# Patient Record
Sex: Female | Born: 1995 | Race: White | Hispanic: No | Marital: Single | Smoking: Never smoker
Health system: Southern US, Community
[De-identification: ages and names within clinical notes are randomized; demographics above are authoritative.]

## PROBLEM LIST (undated history)

## (undated) DIAGNOSIS — K37 Unspecified appendicitis: Secondary | ICD-10-CM

## (undated) HISTORY — PX: TONSILLECTOMY: SUR1361

---

## 2018-03-01 ENCOUNTER — Inpatient Hospital Stay (HOSPITAL_COMMUNITY)
Admission: EM | Admit: 2018-03-01 | Discharge: 2018-03-03 | DRG: 343 | Disposition: A | Discharge status: Home / Self Care | Payer: Medicaid Other | Attending: Surgery | Admitting: Surgery

## 2018-03-01 ENCOUNTER — Encounter (HOSPITAL_COMMUNITY): Payer: Self-pay | Admitting: Emergency Medicine

## 2018-03-01 DIAGNOSIS — K3580 Unspecified acute appendicitis: Secondary | ICD-10-CM | POA: Diagnosis present

## 2018-03-01 HISTORY — DX: Unspecified appendicitis: K37

## 2018-03-01 LAB — CBC
HEMATOCRIT: 34.5 % — AB (ref 36.0–46.0)
Hemoglobin: 11.2 g/dL — ABNORMAL LOW (ref 12.0–15.0)
MCH: 28.6 pg (ref 26.0–34.0)
MCHC: 32.5 g/dL (ref 30.0–36.0)
MCV: 88 fL (ref 78.0–100.0)
PLATELETS: 255 10*3/uL (ref 150–400)
RBC: 3.92 MIL/uL (ref 3.87–5.11)
RDW: 12.4 % (ref 11.5–15.5)
WBC: 12.6 10*3/uL — AB (ref 4.0–10.5)

## 2018-03-01 LAB — URINALYSIS, ROUTINE W REFLEX MICROSCOPIC
Bilirubin Urine: NEGATIVE
Glucose, UA: NEGATIVE mg/dL
Hgb urine dipstick: NEGATIVE
KETONES UR: NEGATIVE mg/dL
LEUKOCYTES UA: NEGATIVE
NITRITE: NEGATIVE
PH: 5 (ref 5.0–8.0)
PROTEIN: NEGATIVE mg/dL
Specific Gravity, Urine: 1.018 (ref 1.005–1.030)

## 2018-03-01 LAB — COMPREHENSIVE METABOLIC PANEL
ALT: 14 U/L (ref 0–44)
AST: 17 U/L (ref 15–41)
Albumin: 4.3 g/dL (ref 3.5–5.0)
Alkaline Phosphatase: 80 U/L (ref 38–126)
Anion gap: 8 (ref 5–15)
BILIRUBIN TOTAL: 0.4 mg/dL (ref 0.3–1.2)
BUN: 9 mg/dL (ref 6–20)
CO2: 24 mmol/L (ref 22–32)
Calcium: 9.4 mg/dL (ref 8.9–10.3)
Chloride: 109 mmol/L (ref 98–111)
Creatinine, Ser: 0.75 mg/dL (ref 0.44–1.00)
GFR calc Af Amer: >60 mL/min (ref >60)
Glucose, Bld: 99 mg/dL (ref 70–99)
POTASSIUM: 3.9 mmol/L (ref 3.5–5.1)
Sodium: 141 mmol/L (ref 135–145)
TOTAL PROTEIN: 7.1 g/dL (ref 6.5–8.1)

## 2018-03-01 LAB — I-STAT BETA HCG BLOOD, ED (MC, WL, AP ONLY)

## 2018-03-01 LAB — LIPASE, BLOOD: Lipase: 33 U/L (ref 11–51)

## 2018-03-01 MED ORDER — FENTANYL CITRATE (PF) 100 MCG/2ML IJ SOLN
50.0000 ug | Freq: Once | INTRAMUSCULAR | Status: AC
  Administered 2018-03-01: 50 ug via INTRAVENOUS
  Filled 2018-03-01: qty 2

## 2018-03-01 MED ORDER — ONDANSETRON HCL 4 MG/2ML IJ SOLN
4.0000 mg | Freq: Once | INTRAMUSCULAR | Status: AC
  Administered 2018-03-01: 4 mg via INTRAVENOUS
  Filled 2018-03-01: qty 2

## 2018-03-01 MED ORDER — SODIUM CHLORIDE 0.9 % IV SOLN
INTRAVENOUS | Status: DC
  Administered 2018-03-01: via INTRAVENOUS

## 2018-03-01 NOTE — ED Triage Notes (Signed)
Pt c/o mid to right abd pain that started at noon today. Reported vomited once. Hx appendicitis that was treated with antibiotics instead of surgery.

## 2018-03-02 ENCOUNTER — Encounter (HOSPITAL_COMMUNITY): Admission: EM | Disposition: A | Discharge status: Home / Self Care | Payer: Self-pay | Source: Home / Self Care

## 2018-03-02 ENCOUNTER — Encounter (HOSPITAL_COMMUNITY): Payer: Self-pay | Admitting: Emergency Medicine

## 2018-03-02 ENCOUNTER — Emergency Department (HOSPITAL_COMMUNITY): Payer: Medicaid Other

## 2018-03-02 ENCOUNTER — Other Ambulatory Visit: Payer: Self-pay

## 2018-03-02 ENCOUNTER — Inpatient Hospital Stay (HOSPITAL_COMMUNITY): Payer: Medicaid Other | Admitting: Registered Nurse

## 2018-03-02 DIAGNOSIS — K3580 Unspecified acute appendicitis: Secondary | ICD-10-CM | POA: Diagnosis present

## 2018-03-02 HISTORY — PX: LAPAROSCOPIC APPENDECTOMY: SHX408

## 2018-03-02 LAB — HIV ANTIBODY (ROUTINE TESTING W REFLEX): HIV SCREEN 4TH GENERATION: NONREACTIVE

## 2018-03-02 LAB — MRSA PCR SCREENING: MRSA BY PCR: NEGATIVE

## 2018-03-02 SURGERY — APPENDECTOMY, LAPAROSCOPIC
Anesthesia: General

## 2018-03-02 MED ORDER — OXYCODONE HCL 5 MG PO TABS
5.0000 mg | ORAL_TABLET | ORAL | Status: DC | PRN
  Administered 2018-03-03: 5 mg via ORAL
  Filled 2018-03-02: qty 1

## 2018-03-02 MED ORDER — SUGAMMADEX SODIUM 200 MG/2ML IV SOLN
INTRAVENOUS | Status: AC
  Filled 2018-03-02: qty 2

## 2018-03-02 MED ORDER — CELECOXIB 200 MG PO CAPS
400.0000 mg | ORAL_CAPSULE | Freq: Two times a day (BID) | ORAL | Status: DC
  Administered 2018-03-02 – 2018-03-03 (×2): 400 mg via ORAL
  Filled 2018-03-02 (×2): qty 2

## 2018-03-02 MED ORDER — FENTANYL CITRATE (PF) 250 MCG/5ML IJ SOLN
INTRAMUSCULAR | Status: AC
  Filled 2018-03-02: qty 5

## 2018-03-02 MED ORDER — METRONIDAZOLE IN NACL 5-0.79 MG/ML-% IV SOLN
500.0000 mg | Freq: Once | INTRAVENOUS | Status: AC
  Administered 2018-03-02: 500 mg via INTRAVENOUS
  Filled 2018-03-02: qty 100

## 2018-03-02 MED ORDER — MIDAZOLAM HCL 2 MG/2ML IJ SOLN: 0.5000 mg | Freq: Once | INTRAMUSCULAR | Status: DC | PRN

## 2018-03-02 MED ORDER — SODIUM CHLORIDE 0.9 % IJ SOLN
INTRAMUSCULAR | Status: AC
  Filled 2018-03-02: qty 50

## 2018-03-02 MED ORDER — SCOPOLAMINE 1 MG/3DAYS TD PT72
1.0000 | MEDICATED_PATCH | Freq: Once | TRANSDERMAL | Status: AC
  Administered 2018-03-02: 1 via TRANSDERMAL
  Filled 2018-03-02: qty 1

## 2018-03-02 MED ORDER — DEXTROSE-NACL 5-0.9 % IV SOLN
INTRAVENOUS | Status: DC
  Administered 2018-03-02 (×2): via INTRAVENOUS

## 2018-03-02 MED ORDER — LIDOCAINE 2% (20 MG/ML) 5 ML SYRINGE
INTRAMUSCULAR | Status: DC | PRN
  Administered 2018-03-02: 50 mg via INTRAVENOUS

## 2018-03-02 MED ORDER — FENTANYL CITRATE (PF) 100 MCG/2ML IJ SOLN
INTRAMUSCULAR | Status: DC | PRN
  Administered 2018-03-02 (×3): 50 ug via INTRAVENOUS

## 2018-03-02 MED ORDER — LACTATED RINGERS IV SOLN
INTRAVENOUS | Status: DC
  Administered 2018-03-02: 1000 mL via INTRAVENOUS

## 2018-03-02 MED ORDER — DEXAMETHASONE SODIUM PHOSPHATE 10 MG/ML IJ SOLN
INTRAMUSCULAR | Status: DC | PRN
  Administered 2018-03-02: 8 mg via INTRAVENOUS

## 2018-03-02 MED ORDER — EPHEDRINE 5 MG/ML INJ
INTRAVENOUS | Status: AC
  Filled 2018-03-02: qty 10

## 2018-03-02 MED ORDER — SUCCINYLCHOLINE CHLORIDE 200 MG/10ML IV SOSY
PREFILLED_SYRINGE | INTRAVENOUS | Status: AC
  Filled 2018-03-02: qty 10

## 2018-03-02 MED ORDER — ONDANSETRON HCL 4 MG/2ML IJ SOLN
INTRAMUSCULAR | Status: DC | PRN
  Administered 2018-03-02: 4 mg via INTRAVENOUS

## 2018-03-02 MED ORDER — SUCCINYLCHOLINE CHLORIDE 200 MG/10ML IV SOSY
PREFILLED_SYRINGE | INTRAVENOUS | Status: DC | PRN
  Administered 2018-03-02: 100 mg via INTRAVENOUS

## 2018-03-02 MED ORDER — ONDANSETRON HCL 4 MG/2ML IJ SOLN
4.0000 mg | Freq: Four times a day (QID) | INTRAMUSCULAR | Status: DC | PRN
  Administered 2018-03-03: 4 mg via INTRAVENOUS
  Filled 2018-03-02: qty 2

## 2018-03-02 MED ORDER — IOPAMIDOL (ISOVUE-300) INJECTION 61%
100.0000 mL | Freq: Once | INTRAVENOUS | Status: AC | PRN
  Administered 2018-03-02: 100 mL via INTRAVENOUS

## 2018-03-02 MED ORDER — DEXAMETHASONE SODIUM PHOSPHATE 10 MG/ML IJ SOLN
INTRAMUSCULAR | Status: AC
  Filled 2018-03-02: qty 1

## 2018-03-02 MED ORDER — SUGAMMADEX SODIUM 200 MG/2ML IV SOLN
INTRAVENOUS | Status: DC | PRN
  Administered 2018-03-02: 130 mg via INTRAVENOUS

## 2018-03-02 MED ORDER — DEXTROSE-NACL 5-0.9 % IV SOLN
INTRAVENOUS | Status: DC
  Administered 2018-03-02 – 2018-03-03 (×2): via INTRAVENOUS

## 2018-03-02 MED ORDER — ENOXAPARIN SODIUM 40 MG/0.4ML ~~LOC~~ SOLN: 40.0000 mg | SUBCUTANEOUS | Status: DC

## 2018-03-02 MED ORDER — PROPOFOL 10 MG/ML IV BOLUS
INTRAVENOUS | Status: AC
  Filled 2018-03-02: qty 20

## 2018-03-02 MED ORDER — FENTANYL CITRATE (PF) 100 MCG/2ML IJ SOLN
50.0000 ug | INTRAMUSCULAR | Status: DC | PRN
  Administered 2018-03-02 (×3): 50 ug via INTRAVENOUS
  Filled 2018-03-02 (×3): qty 2

## 2018-03-02 MED ORDER — PROPOFOL 10 MG/ML IV BOLUS
INTRAVENOUS | Status: DC | PRN
  Administered 2018-03-02: 100 mg via INTRAVENOUS

## 2018-03-02 MED ORDER — IOPAMIDOL (ISOVUE-300) INJECTION 61%
INTRAVENOUS | Status: AC
  Filled 2018-03-02: qty 100

## 2018-03-02 MED ORDER — SODIUM CHLORIDE 0.9 % IV SOLN
2.0000 g | Freq: Once | INTRAVENOUS | Status: AC
  Administered 2018-03-02: 2 g via INTRAVENOUS
  Filled 2018-03-02: qty 20

## 2018-03-02 MED ORDER — SODIUM CHLORIDE 0.9 % IV SOLN
2.0000 g | INTRAVENOUS | Status: DC
  Filled 2018-03-02: qty 20

## 2018-03-02 MED ORDER — MIDAZOLAM HCL 2 MG/2ML IJ SOLN
INTRAMUSCULAR | Status: AC
  Filled 2018-03-02: qty 2

## 2018-03-02 MED ORDER — PROMETHAZINE HCL 25 MG/ML IJ SOLN: 6.2500 mg | INTRAMUSCULAR | Status: DC | PRN

## 2018-03-02 MED ORDER — ACETAMINOPHEN 325 MG PO TABS
650.0000 mg | ORAL_TABLET | ORAL | Status: DC | PRN
  Administered 2018-03-02: 650 mg via ORAL
  Filled 2018-03-02: qty 2

## 2018-03-02 MED ORDER — EPHEDRINE SULFATE-NACL 50-0.9 MG/10ML-% IV SOSY
PREFILLED_SYRINGE | INTRAVENOUS | Status: DC | PRN
  Administered 2018-03-02 (×2): 5 mg via INTRAVENOUS

## 2018-03-02 MED ORDER — ROCURONIUM BROMIDE 10 MG/ML (PF) SYRINGE
PREFILLED_SYRINGE | INTRAVENOUS | Status: DC | PRN
  Administered 2018-03-02: 30 mg via INTRAVENOUS

## 2018-03-02 MED ORDER — ROCURONIUM BROMIDE 10 MG/ML (PF) SYRINGE
PREFILLED_SYRINGE | INTRAVENOUS | Status: AC
  Filled 2018-03-02: qty 10

## 2018-03-02 MED ORDER — HYDROMORPHONE HCL 1 MG/ML IJ SOLN: 0.2500 mg | INTRAMUSCULAR | Status: DC | PRN

## 2018-03-02 MED ORDER — MIDAZOLAM HCL 5 MG/5ML IJ SOLN
INTRAMUSCULAR | Status: DC | PRN
  Administered 2018-03-02: 2 mg via INTRAVENOUS

## 2018-03-02 MED ORDER — BUPIVACAINE-EPINEPHRINE 0.5% -1:200000 IJ SOLN
INTRAMUSCULAR | Status: DC | PRN
  Administered 2018-03-02: 30 mL

## 2018-03-02 MED ORDER — METRONIDAZOLE IN NACL 5-0.79 MG/ML-% IV SOLN
500.0000 mg | Freq: Three times a day (TID) | INTRAVENOUS | Status: DC
  Administered 2018-03-02: 500 mg via INTRAVENOUS
  Filled 2018-03-02: qty 100

## 2018-03-02 MED ORDER — MEPERIDINE HCL 50 MG/ML IJ SOLN: 6.2500 mg | INTRAMUSCULAR | Status: DC | PRN

## 2018-03-02 MED ORDER — GABAPENTIN 300 MG PO CAPS
300.0000 mg | ORAL_CAPSULE | Freq: Two times a day (BID) | ORAL | Status: DC
  Administered 2018-03-02 – 2018-03-03 (×3): 300 mg via ORAL
  Filled 2018-03-02 (×3): qty 1

## 2018-03-02 MED ORDER — LIDOCAINE 2% (20 MG/ML) 5 ML SYRINGE
INTRAMUSCULAR | Status: AC
  Filled 2018-03-02: qty 5

## 2018-03-02 MED ORDER — ONDANSETRON HCL 4 MG/2ML IJ SOLN
INTRAMUSCULAR | Status: AC
  Filled 2018-03-02: qty 2

## 2018-03-02 SURGICAL SUPPLY — 35 items
APPLIER CLIP 5 13 M/L LIGAMAX5 (MISCELLANEOUS)
APPLIER CLIP ROT 10 11.4 M/L (STAPLE)
CABLE HIGH FREQUENCY MONO STRZ (ELECTRODE) IMPLANT
CHLORAPREP W/TINT 26ML (MISCELLANEOUS) ×3 IMPLANT
CLIP APPLIE 5 13 M/L LIGAMAX5 (MISCELLANEOUS) IMPLANT
CLIP APPLIE ROT 10 11.4 M/L (STAPLE) IMPLANT
COVER SURGICAL LIGHT HANDLE (MISCELLANEOUS) ×3 IMPLANT
CUTTER FLEX LINEAR 45M (STAPLE) IMPLANT
DECANTER SPIKE VIAL GLASS SM (MISCELLANEOUS) IMPLANT
DERMABOND ADVANCED (GAUZE/BANDAGES/DRESSINGS) ×2
DERMABOND ADVANCED .7 DNX12 (GAUZE/BANDAGES/DRESSINGS) ×1 IMPLANT
DRAPE LAPAROSCOPIC ABDOMINAL (DRAPES) ×3 IMPLANT
ELECT REM PT RETURN 15FT ADLT (MISCELLANEOUS) ×3 IMPLANT
GLOVE BIOGEL PI IND STRL 7.5 (GLOVE) ×1 IMPLANT
GLOVE BIOGEL PI INDICATOR 7.5 (GLOVE) ×2
GLOVE ECLIPSE 7.5 STRL STRAW (GLOVE) ×3 IMPLANT
GOWN STRL REUS W/TWL XL LVL3 (GOWN DISPOSABLE) ×6 IMPLANT
KIT BASIN OR (CUSTOM PROCEDURE TRAY) ×3 IMPLANT
PAD POSITIONING PINK XL (MISCELLANEOUS) ×3 IMPLANT
POUCH RETRIEVAL ECOSAC 10 (ENDOMECHANICALS) ×1 IMPLANT
POUCH RETRIEVAL ECOSAC 10MM (ENDOMECHANICALS) ×2
POUCH SPECIMEN RETRIEVAL 10MM (ENDOMECHANICALS) IMPLANT
RELOAD 45 VASCULAR/THIN (ENDOMECHANICALS) ×3 IMPLANT
RELOAD STAPLE TA45 3.5 REG BLU (ENDOMECHANICALS) IMPLANT
SCISSORS LAP 5X35 DISP (ENDOMECHANICALS) ×3 IMPLANT
SET IRRIG TUBING LAPAROSCOPIC (IRRIGATION / IRRIGATOR) ×3 IMPLANT
SHEARS HARMONIC ACE PLUS 36CM (ENDOMECHANICALS) ×3 IMPLANT
SLEEVE XCEL OPT CAN 5 100 (ENDOMECHANICALS) ×3 IMPLANT
SUT MNCRL AB 4-0 PS2 18 (SUTURE) ×3 IMPLANT
TOWEL OR 17X26 10 PK STRL BLUE (TOWEL DISPOSABLE) ×3 IMPLANT
TRAY FOLEY MTR SLVR 16FR STAT (SET/KITS/TRAYS/PACK) ×3 IMPLANT
TRAY LAPAROSCOPIC (CUSTOM PROCEDURE TRAY) ×3 IMPLANT
TROCAR BLADELESS OPT 5 100 (ENDOMECHANICALS) ×3 IMPLANT
TROCAR XCEL BLUNT TIP 100MML (ENDOMECHANICALS) ×3 IMPLANT
TUBING INSUF HEATED (TUBING) ×3 IMPLANT

## 2018-03-02 NOTE — Progress Notes (Signed)
I have reviewed and concur with this student's documentation.   

## 2018-03-02 NOTE — Anesthesia Postprocedure Evaluation (Signed)
Anesthesia Post Note  Patient: Sherri Willis  Procedure(s) Performed: APPENDECTOMY LAPAROSCOPIC (N/A )     Patient location during evaluation: PACU Anesthesia Type: General Level of consciousness: awake and alert, oriented and patient cooperative Pain management: pain level controlled Vital Signs Assessment: post-procedure vital signs reviewed and stable Respiratory status: spontaneous breathing, nonlabored ventilation and respiratory function stable Cardiovascular status: blood pressure returned to baseline and stable Postop Assessment: no apparent nausea or vomiting Anesthetic complications: no    Last Vitals:  Vitals:   03/02/18 1415 03/02/18 1515  BP: (!) 116/57 (!) 119/55  Pulse: 71 79  Resp: 14 16  Temp: 36.7 C 36.9 C  SpO2: 100% 100%    Last Pain:  Vitals:   03/02/18 1515  TempSrc: Oral  PainSc: 6                  Cachet Mccutchen,E. Timothee Gali

## 2018-03-02 NOTE — Transfer of Care (Signed)
Immediate Anesthesia Transfer of Care Note  Patient: Sherri Willis  Procedure(s) Performed: APPENDECTOMY LAPAROSCOPIC (N/A )  Patient Location: PACU  Anesthesia Type:General  Level of Consciousness: awake, alert , oriented and patient cooperative  Airway & Oxygen Therapy: Patient Spontanous Breathing and Patient connected to face mask oxygen  Post-op Assessment: Report given to RN, Post -op Vital signs reviewed and stable and Patient moving all extremities  Post vital signs: Reviewed and stable  Last Vitals:  Vitals Value Taken Time  BP 130/56 03/02/2018  1:18 PM  Temp    Pulse 99 03/02/2018  1:21 PM  Resp 9 03/02/2018  1:21 PM  SpO2 100 % 03/02/2018  1:21 PM  Vitals shown include unvalidated device data.  Last Pain:  Vitals:   03/02/18 1116  TempSrc:   PainSc: 5       Patients Stated Pain Goal: 5 (03/02/18 1116)  Complications: No apparent anesthesia complications

## 2018-03-02 NOTE — H&P (Signed)
Sherri Willis is an 22 y.o. female.   Chief Complaint: Abdominal pain HPI: Asked to see patient at the request of Dr. Nicholes Stairs for abdominal pain.  The patient presented with a 2-day history of generalized abdominal pain which now localized to her right lower quadrant.  The pain is sharp in nature without radiation.  The pain is a 5-6 out of 10.  Location is right lower quadrant of abdomen.  There is no associated nausea or vomiting.  CT scan showed uncomplicated acute appendicitis.  She denies any associated fever or chills.  Past Medical History:  Diagnosis Date  . Appendicitis     Past Surgical History:  Procedure Laterality Date  . TONSILLECTOMY      No family history on file. Social History:  reports that she has never smoked. She has never used smokeless tobacco. Her alcohol and drug histories are not on file.  Allergies:  Allergies  Allergen Reactions  . Morphine And Related Anxiety    No medications prior to admission.    Results for orders placed or performed during the hospital encounter of 03/01/18 (from the past 48 hour(s))  Lipase, blood     Status: None   Collection Time: 03/01/18  7:44 PM  Result Value Ref Range   Lipase 33 11 - 51 U/L    Comment: Performed at [facility], [place] [address] [place], [place] [address]  Comprehensive metabolic panel     Status: None   Collection Time: 03/01/18  7:44 PM  Result Value Ref Range   Sodium 141 135 - 145 mmol/L   Potassium 3.9 3.5 - 5.1 mmol/L   Chloride 109 98 - 111 mmol/L   CO2 24 22 - 32 mmol/L   Glucose, Bld 99 70 - 99 mg/dL   BUN 9 6 - 20 mg/dL   Creatinine, Ser 0.75 0.44 - 1.00 mg/dL   Calcium 9.4 8.9 - 10.3 mg/dL   Total Protein 7.1 6.5 - 8.1 g/dL   Albumin 4.3 3.5 - 5.0 g/dL   AST 17 15 - 41 U/L   ALT 14 0 - 44 U/L   Alkaline Phosphatase 80 38 - 126 U/L   Total Bilirubin 0.4 0.3 - 1.2 mg/dL   GFR calc non Af Amer >60 >60 mL/min   GFR calc Af Amer >60 >60 mL/min    Comment:  (NOTE) The eGFR has been calculated using the CKD EPI equation. This calculation has not been validated in all clinical situations. eGFR's persistently <60 mL/min signify possible Chronic Kidney Disease.    Anion gap 8 5 - 15    Comment: Performed at [facility], [place] [address], [place], [place] [address]  CBC     Status: Abnormal   Collection Time: 03/01/18  7:44 PM  Result Value Ref Range   WBC 12.6 (H) 4.0 - 10.5 K/uL   RBC 3.92 3.87 - 5.11 MIL/uL   Hemoglobin 11.2 (L) 12.0 - 15.0 g/dL   HCT 34.5 (L) 36.0 - 46.0 %   MCV 88.0 78.0 - 100.0 fL   MCH 28.6 26.0 - 34.0 pg   MCHC 32.5 30.0 - 36.0 g/dL   RDW 12.4 11.5 - 15.5 %   Platelets 255 150 - 400 K/uL    Comment: Performed at [facility], [place] [address], [place], [place] [address]  I-Stat beta hCG blood, ED     Status: None   Collection Time: 03/01/18  7:48 PM  Result Value Ref Range  I-stat hCG, quantitative <5.0 <5 mIU/mL   Comment 3            Comment:   GEST. AGE      CONC.  (mIU/mL)   <=1 WEEK        5 - 50     2 WEEKS       50 - 500     3 WEEKS       100 - 10,000     4 WEEKS     1,000 - 30,000        FEMALE AND NON-PREGNANT FEMALE:     LESS THAN 5 mIU/mL   Urinalysis, Routine w reflex microscopic     Status: None   Collection Time: 03/01/18 10:07 PM  Result Value Ref Range   Color, Urine YELLOW YELLOW   APPearance CLEAR CLEAR   Specific Gravity, Urine 1.018 1.005 - 1.030   pH 5.0 5.0 - 8.0   Glucose, UA NEGATIVE NEGATIVE mg/dL   Hgb urine dipstick NEGATIVE NEGATIVE   Bilirubin Urine NEGATIVE NEGATIVE   Ketones, ur NEGATIVE NEGATIVE mg/dL   Protein, ur NEGATIVE NEGATIVE mg/dL   Nitrite NEGATIVE NEGATIVE   Leukocytes, UA NEGATIVE NEGATIVE    Comment: Performed at [facility], [place] [address] [place], [place] [address]   Ct Abdomen Pelvis W Contrast  Result Date: 03/02/2018 CLINICAL DATA:  Right-sided abdominal pain. Patient reports history of  appendicitis that was treated with antibiotics rather than surgery. EXAM: CT ABDOMEN AND PELVIS WITH CONTRAST TECHNIQUE: Multidetector CT imaging of the abdomen and pelvis was performed using the standard protocol following bolus administration of intravenous contrast. CONTRAST:  15m ISOVUE-300 IOPAMIDOL (ISOVUE-300) INJECTION 61% COMPARISON:  None available. FINDINGS: Lower chest: Lung bases are clear. Hepatobiliary: No focal liver abnormality is seen. No gallstones, gallbladder wall thickening, or biliary dilatation. Pancreas: Unremarkable. No pancreatic ductal dilatation or surrounding inflammatory changes. Spleen: Normal in size without focal abnormality. Adrenals/Urinary Tract: Adrenal glands are unremarkable. Kidneys are normal, without renal calculi, focal lesion, or hydronephrosis. Bladder is unremarkable. Stomach/Bowel: Acute appendicitis as described below. Stomach physiologically distended. No small or large bowel wall thickening, inflammatory change or obstruction. Moderate stool in the ascending and transverse colon. Appendix: Location: Medial to the cecum. Diameter: 8-9 mm Appendicolith: No Mucosal hyper-enhancement: Yes Extraluminal gas: No Periappendiceal collection: No.  Mild periappendiceal stranding. Vascular/Lymphatic: Prominent ileocolic nodes are likely reactive. No acute vascular findings. Reproductive: Uterus and bilateral adnexa are unremarkable. Physiologic involuting corpus luteal cyst in the left ovary. Other: Small volume of free fluid in the pelvis may be reactive or physiologic. No free air. No abdominopelvic abscess. Musculoskeletal: There are no acute or suspicious osseous abnormalities. IMPRESSION: Uncomplicated acute appendicitis. Electronically Signed   By: MKeith RakeM.D.   On: 03/02/2018 00:55    Review of Systems  Constitutional: Negative for chills and fever.  HENT: Negative.   Eyes: Negative.   Respiratory: Negative.   Cardiovascular: Negative.    Gastrointestinal: Positive for abdominal pain.  Genitourinary: Negative.   Skin: Negative.   Neurological: Negative.   Endo/Heme/Allergies: Negative.   Psychiatric/Behavioral: Negative.     Blood pressure (!) 101/57, pulse 64, temperature 98.4 F (36.9 C), temperature source Oral, resp. rate 16, height '5\' 5"'  (1.651 m), weight 57.9 kg, last menstrual period 02/15/2018, SpO2 100 %. Physical Exam  Constitutional: She is oriented to person, place, and time. She appears well-nourished.  HENT:  Head: Normocephalic.  Eyes: Pupils are equal, round, and reactive to light.  EOM are normal.  Neck: Normal range of motion. Neck supple.  Cardiovascular: Normal rate and regular rhythm.  Respiratory: Effort normal and breath sounds normal.  GI: There is tenderness in the right lower quadrant. There is guarding and tenderness at McBurney's point. There is no rigidity.  Musculoskeletal: Normal range of motion.  Neurological: She is alert and oriented to person, place, and time.  Skin: Skin is warm and dry.  Psychiatric: She has a normal mood and affect. Her behavior is normal. Judgment and thought content normal.     Assessment/Plan Acute appendicitis uncomplicated  Discussed medical and surgical options.  Discussed the pros and cons of each.  Discussed laparoscopic appendectomy.  The patient and her mother agreed to proceed with laparoscopic appendectomy.  Dr. Excell Seltzer to see later this morning to coordinate timing of procedure.  Turner Daniels, MD 03/02/2018, 6:08 AM

## 2018-03-02 NOTE — Discharge Instructions (Signed)
[facility] ______[facility] °LAPAROSCOPIC SURGERY: POST OP INSTRUCTIONS °Always review your discharge instruction sheet given to you by the facility where your surgery was performed. °IF YOU HAVE DISABILITY OR FAMILY LEAVE FORMS, YOU MUST BRING THEM TO THE OFFICE FOR PROCESSING.   °DO NOT GIVE THEM TO YOUR DOCTOR. ° °1. A prescription for pain medication may be given to you upon discharge.  Take your pain medication as prescribed, if needed.  If narcotic pain medicine is not needed, then you may take acetaminophen (Tylenol) or ibuprofen (Advil) as needed. °2. Take your usually prescribed medications unless otherwise directed. °3. If you need a refill on your pain medication, please contact your pharmacy.  They will contact our office to request authorization. Prescriptions will not be filled after 5pm or on week-ends. °4. You should follow a light diet the first few days after arrival home, such as soup and crackers, etc.  Be sure to include lots of fluids daily. °5. Most patients will experience some swelling and bruising in the area of the incisions.  Ice packs will help.  Swelling and bruising can take several days to resolve.  °6. It is common to experience some constipation if taking pain medication after surgery.  Increasing fluid intake and taking a stool softener (such as Colace) will usually help or prevent this problem from occurring.  A mild laxative (Milk of Magnesia or Miralax) should be taken according to package instructions if there are no bowel movements after 48 hours. °7. Unless discharge instructions indicate otherwise, you may remove your bandages 24-48 hours after surgery, and you may shower at that time.  You may have steri-strips (small skin tapes) in place directly over the incision.  These strips should be left on the skin for 7-10 days.  If your surgeon used skin glue on the incision, you may shower in 24 hours.  The glue will flake off over the next 2-3 weeks.  Any sutures or  staples will be removed at the office during your follow-up visit. °8. ACTIVITIES:  You may resume regular (light) daily activities beginning the next day--such as daily self-care, walking, climbing stairs--gradually increasing activities as tolerated.  You may have sexual intercourse when it is comfortable.  Refrain from any heavy lifting or straining until approved by your doctor. °a. You may drive when you are no longer taking prescription pain medication, you can comfortably wear a seatbelt, and you can safely maneuver your car and apply brakes. °b. RETURN TO WORK:  __________________________________________________________ °9. You should see your doctor in the office for a follow-up appointment approximately 2-3 weeks after your surgery.  Make sure that you call for this appointment within a day or two after you arrive home to insure a convenient appointment time. °10. OTHER INSTRUCTIONS: __________________________________________________________________________________________________________________________ __________________________________________________________________________________________________________________________ °WHEN TO CALL YOUR DOCTOR: °1. Fever over 101.0 °2. Inability to urinate °3. Continued bleeding from incision. °4. Increased pain, redness, or drainage from the incision. °5. Increasing abdominal pain ° °The clinic staff is available to answer your questions during regular business hours.  Please don’t hesitate to call and ask to speak to one of the nurses for clinical concerns.  If you have a medical emergency, go to the nearest emergency room or call 911.  A surgeon from [facility] is always on call at the hospital. °[address], [address], [place], [place]  [address] ? [address] [address], [place], [place]   [address] °[phone] ? [phone] ? FAX [phone] °Web site:   [email or web address] °

## 2018-03-02 NOTE — ED Notes (Signed)
ED TO INPATIENT HANDOFF REPORT  Name/Age/Gender Sherri Willis 22 y.o. female  Code Status   Home/SNF/Other Home  Chief Complaint Abd pain  Level of Care/Admitting Diagnosis ED Disposition    ED Disposition Condition [facility] Area: [place] [[id number]]  Level of Care: Med-Surg [16]  Diagnosis: Appendicitis [789381]  Admitting Physician: [place], [place]  Attending Physician: [facility], MD [[id number]]  PT Class (Do Not Modify): Observation [104]  PT Acc Code (Do Not Modify): Observation [10022]       Medical History Past Medical History:  Diagnosis Date  . Appendicitis     Allergies Allergies  Allergen Reactions  . Morphine And Related Anxiety    IV Location/Drains/Wounds Patient Lines/Drains/Airways Status   Active Line/Drains/Airways    Name:   Placement date:   Placement time:   Site:   Days:   Peripheral IV 03/01/18 Right Antecubital   03/01/18    2348    Antecubital   1   Peripheral IV 03/02/18 Left;Anterior Forearm   03/02/18    0143    Forearm   less than 1          Labs/Imaging Results for orders placed or performed during the hospital encounter of 03/01/18 (from the past 48 hour(s))  Lipase, blood     Status: None   Collection Time: 03/01/18  7:44 PM  Result Value Ref Range   Lipase 33 11 - 51 U/L    Comment: Performed at [facility], [place] [address], [place], [place] [address]  Comprehensive metabolic panel     Status: None   Collection Time: 03/01/18  7:44 PM  Result Value Ref Range   Sodium 141 135 - 145 mmol/L   Potassium 3.9 3.5 - 5.1 mmol/L   Chloride 109 98 - 111 mmol/L   CO2 24 22 - 32 mmol/L   Glucose, Bld 99 70 - 99 mg/dL   BUN 9 6 - 20 mg/dL   Creatinine, Ser 0.75 0.44 - 1.00 mg/dL   Calcium 9.4 8.9 - 10.3 mg/dL   Total Protein 7.1 6.5 - 8.1 g/dL   Albumin 4.3 3.5 - 5.0 g/dL   AST 17 15 - 41 U/L   ALT 14 0 - 44 U/L   Alkaline Phosphatase 80 38 - 126 U/L   Total Bilirubin  0.4 0.3 - 1.2 mg/dL   GFR calc non Af Amer >60 >60 mL/min   GFR calc Af Amer >60 >60 mL/min    Comment: (NOTE) The eGFR has been calculated using the CKD EPI equation. This calculation has not been validated in all clinical situations. eGFR's persistently <60 mL/min signify possible Chronic Kidney Disease.    Anion gap 8 5 - 15    Comment: Performed at [facility], [place] [address] [place], [place] [address]  CBC     Status: Abnormal   Collection Time: 03/01/18  7:44 PM  Result Value Ref Range   WBC 12.6 (H) 4.0 - 10.5 K/uL   RBC 3.92 3.87 - 5.11 MIL/uL   Hemoglobin 11.2 (L) 12.0 - 15.0 g/dL   HCT 34.5 (L) 36.0 - 46.0 %   MCV 88.0 78.0 - 100.0 fL   MCH 28.6 26.0 - 34.0 pg   MCHC 32.5 30.0 - 36.0 g/dL   RDW 12.4 11.5 - 15.5 %   Platelets 255 150 - 400 K/uL    Comment: Performed at [facility], [place] [address] [place], [place]  [address]  I-Stat beta hCG blood, ED     Status: None   Collection Time: 03/01/18  7:48 PM  Result Value Ref Range   I-stat hCG, quantitative <5.0 <5 mIU/mL   Comment 3            Comment:   GEST. AGE      CONC.  (mIU/mL)   <=1 WEEK        5 - 50     2 WEEKS       50 - 500     3 WEEKS       100 - 10,000     4 WEEKS     1,000 - 30,000        FEMALE AND NON-PREGNANT FEMALE:     LESS THAN 5 mIU/mL   Urinalysis, Routine w reflex microscopic     Status: None   Collection Time: 03/01/18 10:07 PM  Result Value Ref Range   Color, Urine YELLOW YELLOW   APPearance CLEAR CLEAR   Specific Gravity, Urine 1.018 1.005 - 1.030   pH 5.0 5.0 - 8.0   Glucose, UA NEGATIVE NEGATIVE mg/dL   Hgb urine dipstick NEGATIVE NEGATIVE   Bilirubin Urine NEGATIVE NEGATIVE   Ketones, ur NEGATIVE NEGATIVE mg/dL   Protein, ur NEGATIVE NEGATIVE mg/dL   Nitrite NEGATIVE NEGATIVE   Leukocytes, UA NEGATIVE NEGATIVE    Comment: Performed at [facility], [place] [address], [place], [place] [address]   Ct Abdomen Pelvis W  Contrast  Result Date: 03/02/2018 CLINICAL DATA:  Right-sided abdominal pain. Patient reports history of appendicitis that was treated with antibiotics rather than surgery. EXAM: CT ABDOMEN AND PELVIS WITH CONTRAST TECHNIQUE: Multidetector CT imaging of the abdomen and pelvis was performed using the standard protocol following bolus administration of intravenous contrast. CONTRAST:  176m ISOVUE-300 IOPAMIDOL (ISOVUE-300) INJECTION 61% COMPARISON:  None available. FINDINGS: Lower chest: Lung bases are clear. Hepatobiliary: No focal liver abnormality is seen. No gallstones, gallbladder wall thickening, or biliary dilatation. Pancreas: Unremarkable. No pancreatic ductal dilatation or surrounding inflammatory changes. Spleen: Normal in size without focal abnormality. Adrenals/Urinary Tract: Adrenal glands are unremarkable. Kidneys are normal, without renal calculi, focal lesion, or hydronephrosis. Bladder is unremarkable. Stomach/Bowel: Acute appendicitis as described below. Stomach physiologically distended. No small or large bowel wall thickening, inflammatory change or obstruction. Moderate stool in the ascending and transverse colon. Appendix: Location: Medial to the cecum. Diameter: 8-9 mm Appendicolith: No Mucosal hyper-enhancement: Yes Extraluminal gas: No Periappendiceal collection: No.  Mild periappendiceal stranding. Vascular/Lymphatic: Prominent ileocolic nodes are likely reactive. No acute vascular findings. Reproductive: Uterus and bilateral adnexa are unremarkable. Physiologic involuting corpus luteal cyst in the left ovary. Other: Small volume of free fluid in the pelvis may be reactive or physiologic. No free air. No abdominopelvic abscess. Musculoskeletal: There are no acute or suspicious osseous abnormalities. IMPRESSION: Uncomplicated acute appendicitis. Electronically Signed   By: MKeith RakeM.D.   On: 03/02/2018 00:55    Pending Labs Unresulted Labs (From admission, onward)   None       Vitals/Pain Today's Vitals   03/02/18 0000 03/02/18 0045 03/02/18 0100 03/02/18 0149  BP:    105/66  Pulse:  66 67 71  Resp:    18  Temp:      TempSrc:      SpO2:  99% 99% 99%  Weight:      Height:      PainSc: 5    5     Isolation Precautions  No active isolations  Medications Medications  0.9 %  sodium chloride infusion ( Intravenous New Bag/Given 03/01/18 2351)  sodium chloride 0.9 % injection (has no administration in time range)  cefTRIAXone (ROCEPHIN) 2 g in sodium chloride 0.9 % 100 mL IVPB (2 g Intravenous New Bag/Given 03/02/18 0136)    And  metroNIDAZOLE (FLAGYL) IVPB 500 mg (500 mg Intravenous New Bag/Given 03/02/18 0146)  fentaNYL (SUBLIMAZE) injection 50 mcg (50 mcg Intravenous Given 03/01/18 2357)  ondansetron (ZOFRAN) injection 4 mg (4 mg Intravenous Given 03/01/18 2351)  iopamidol (ISOVUE-300) 61 % injection 100 mL (100 mLs Intravenous Contrast Given 03/02/18 0023)    Mobility walks

## 2018-03-02 NOTE — Anesthesia Preprocedure Evaluation (Addendum)
Anesthesia Evaluation  Patient identified by MRN, date of birth, ID band Patient awake    Reviewed: Allergy & Precautions, NPO status , Patient's Chart, lab work & pertinent test results  History of Anesthesia Complications Negative for: history of anesthetic complications  Airway Mallampati: II  TM Distance: >3 FB Neck ROM: Full    Dental  (+) Dental Advisory Given   Pulmonary neg pulmonary ROS,    breath sounds clear to auscultation       Cardiovascular negative cardio ROS   Rhythm:Regular Rate:Normal     Neuro/Psych negative neurological ROS     GI/Hepatic Neg liver ROS, N/v with acute appy   Endo/Other  negative endocrine ROS  Renal/GU negative Renal ROS     Musculoskeletal   Abdominal   Peds  Hematology negative hematology ROS (+)   Anesthesia Other Findings   Reproductive/Obstetrics                             Anesthesia Physical Anesthesia Plan  ASA: I  Anesthesia Plan: General   Post-op Pain Management:    Induction: Intravenous and Rapid sequence  PONV Risk Score and Plan: 4 or greater and Ondansetron, Dexamethasone and Scopolamine patch - Pre-op  Airway Management Planned: Oral ETT  Additional Equipment:   Intra-op Plan:   Post-operative Plan: Extubation in OR  Informed Consent: I have reviewed the patients History and Physical, chart, labs and discussed the procedure including the risks, benefits and alternatives for the proposed anesthesia with the patient or authorized representative who has indicated his/her understanding and acceptance.   Dental advisory given  Plan Discussed with: CRNA and Surgeon  Anesthesia Plan Comments: (Plan routine monitors, GETA)        Anesthesia Quick Evaluation

## 2018-03-02 NOTE — Anesthesia Procedure Notes (Signed)
Procedure Name: Intubation Date/Time: 03/02/2018 12:22 PM Performed by: Victoriano Lain, CRNA Pre-anesthesia Checklist: Patient identified, Emergency Drugs available, Suction available, Patient being monitored and Timeout performed Patient Re-evaluated:Patient Re-evaluated prior to induction Oxygen Delivery Method: Circle system utilized Preoxygenation: Pre-oxygenation with 100% oxygen Induction Type: IV induction, Cricoid Pressure applied and Rapid sequence Laryngoscope Size: Mac and 4 Grade View: Grade I Tube type: Oral Tube size: 7.5 mm Number of attempts: 1 Airway Equipment and Method: Stylet Placement Confirmation: ETT inserted through vocal cords under direct vision,  positive ETCO2 and breath sounds checked- equal and bilateral Secured at: 21 cm Tube secured with: Tape Dental Injury: Teeth and Oropharynx as per pre-operative assessment  Comments: RSI with cricoid pressure by Dr. Glennon Mac. Grade 1 view with a MAC 4. +ETCO2, BBS=. ATOI.

## 2018-03-02 NOTE — Progress Notes (Signed)
Day of Surgery   Subjective: Pain is about the same today.  I have reviewed her CT scan which shows apparent uncomplicated appendicitis.  She has had apparently appendicitis treated with antibiotics alone several years ago which resolved without symptoms until the last couple of days.  Objective: Vital signs in last 24 hours: Temp:  [98.1 F (36.7 C)-99.2 F (37.3 C)] 98.1 F (36.7 C) (10/03 1057) Pulse Rate:  [60-93] 60 (10/03 1057) Resp:  [16-18] 16 (10/03 1057) BP: (97-124)/(51-76) 97/51 (10/03 1057) SpO2:  [99 %-100 %] 100 % (10/03 1057) Weight:  [57.9 kg-61.2 kg] 61.2 kg (10/03 1116) Last BM Date: 03/01/18  Intake/Output from previous day: 10/02 0701 - 10/03 0700 In: 702.2 [I.V.:502.2; IV Piggyback:200] Out: -  Intake/Output this shift: No intake/output data recorded.  General appearance: alert, cooperative and no distress GI: Moderate right lower quadrant tenderness  Lab Results:  Recent Labs    03/01/18 1944  WBC 12.6*  HGB 11.2*  HCT 34.5*  PLT 255   BMET Recent Labs    03/01/18 1944  NA 141  K 3.9  CL 109  CO2 24  GLUCOSE 99  BUN 9  CREATININE 0.75  CALCIUM 9.4     Studies/Results: Ct Abdomen Pelvis W Contrast  Result Date: 03/02/2018 CLINICAL DATA:  Right-sided abdominal pain. Patient reports history of appendicitis that was treated with antibiotics rather than surgery. EXAM: CT ABDOMEN AND PELVIS WITH CONTRAST TECHNIQUE: Multidetector CT imaging of the abdomen and pelvis was performed using the standard protocol following bolus administration of intravenous contrast. CONTRAST:  ISOVUE-300 IOPAMIDOL (ISOVUE-300) INJECTION 61% COMPARISON:  None available. FINDINGS: Lower chest: Lung bases are clear. Hepatobiliary: No focal liver abnormality is seen. No gallstones, gallbladder wall thickening, or biliary dilatation. Pancreas: Unremarkable. No pancreatic ductal dilatation or surrounding inflammatory changes. Spleen: Normal in size without focal  abnormality. Adrenals/Urinary Tract: Adrenal glands are unremarkable. Kidneys are normal, without renal calculi, focal lesion, or hydronephrosis. Bladder is unremarkable. Stomach/Bowel: Acute appendicitis as described below. Stomach physiologically distended. No small or large bowel wall thickening, inflammatory change or obstruction. Moderate stool in the ascending and transverse colon. Appendix: Location: Medial to the cecum. Diameter: 8-9 mm Appendicolith: No Mucosal hyper-enhancement: Yes Extraluminal gas: No Periappendiceal collection: No.  Mild periappendiceal stranding. Vascular/Lymphatic: Prominent ileocolic nodes are likely reactive. No acute vascular findings. Reproductive: Uterus and bilateral adnexa are unremarkable. Physiologic involuting corpus luteal cyst in the left ovary. Other: Small volume of free fluid in the pelvis may be reactive or physiologic. No free air. No abdominopelvic abscess. Musculoskeletal: There are no acute or suspicious osseous abnormalities. IMPRESSION: Uncomplicated acute appendicitis. Electronically Signed   By: Narda Rutherford M.D.   On: 03/02/2018 00:55    Anti-infectives: Anti-infectives (From admission, onward)   Start     Dose/Rate Route Frequency Ordered Stop   03/02/18 2200  cefTRIAXone (ROCEPHIN) 2 g in sodium chloride 0.9 % 100 mL IVPB     2 g 200 mL/hr over 30 Minutes Intravenous Every 24 hours 03/02/18 0625     03/02/18 1000  metroNIDAZOLE (FLAGYL) IVPB 500 mg     500 mg 100 mL/hr over 60 Minutes Intravenous Every 8 hours 03/02/18 0625     03/02/18 0115  cefTRIAXone (ROCEPHIN) 2 g in sodium chloride 0.9 % 100 mL IVPB     2 g 200 mL/hr over 30 Minutes Intravenous  Once 03/02/18 0113 03/02/18 0212   03/02/18 0115  metroNIDAZOLE (FLAGYL) IVPB 500 mg     500 mg  100 mL/hr over 60 Minutes Intravenous  Once 03/02/18 0113 03/02/18 0246      Assessment/Plan: All consistent with recurrent acute appendicitis.  I discussed the illness with the patient and  her mother.  We discussed options of antibiotic management versus surgery but particularly with recurrence I think surgery would be the best course and they agree.  I discussed the nature of the procedure, expected recovery, risks of anesthetic complications, bleeding, infection or visceral injury.  All questions were answered and they agree to proceed.    LOS: 0 days    Mariella Saa 03/02/2018

## 2018-03-02 NOTE — ED Provider Notes (Signed)
[facility]-EMERGENCY DEPT Provider Note   CSN: [id number] Arrival date & time: 03/01/18  1812     History   Chief Complaint Chief Complaint  Patient presents with  . Abdominal Pain  . Emesis    HPI Sherri Willis is a 22 y.o. female.  The history is provided by the patient.  Abdominal Pain   This is a new problem. The current episode started 12 to 24 hours ago. The problem occurs constantly. The problem has been gradually worsening. The pain is associated with an unknown factor. The pain is located in the periumbilical region and RLQ. The quality of the pain is sharp. The pain is severe. Associated symptoms include nausea and vomiting. Pertinent negatives include fever. Nothing aggravates the symptoms. Nothing relieves the symptoms. Past workup does not include GI consult. Her past medical history does not include PUD.  Emesis   Associated symptoms include abdominal pain. Pertinent negatives include no fever.    Past Medical History:  Diagnosis Date  . Appendicitis     Patient Active Problem List   Diagnosis Date Noted  . Appendicitis 03/02/2018    Past Surgical History:  Procedure Laterality Date  . TONSILLECTOMY       OB History   None      Home Medications    Prior to Admission medications   Not on File    Family History No family history on file.  Social History Social History   Tobacco Use  . Smoking status: Never Smoker  . Smokeless tobacco: Never Used  Substance Use Topics  . Alcohol use: Not on file  . Drug use: Not on file     Allergies   Morphine and related   Review of Systems Review of Systems  Constitutional: Negative for diaphoresis and fever.  Gastrointestinal: Positive for abdominal pain, nausea and vomiting.  All other systems reviewed and are negative.    Physical Exam Updated Vital Signs BP 114/76 (BP Location: Left Arm)   Pulse 67   Temp 99.2 F (37.3 C) (Oral)   Resp 16   Ht 5\' 5"  (1.651  m)   Wt 57.9 kg   LMP 02/15/2018   SpO2 99%   BMI 21.23 kg/m   Physical Exam  Constitutional: She is oriented to person, place, and time. She appears well-developed and well-nourished. No distress.  HENT:  Head: Normocephalic and atraumatic.  Mouth/Throat: No oropharyngeal exudate.  Eyes: Pupils are equal, round, and reactive to light. Conjunctivae are normal.  Neck: Normal range of motion. Neck supple.  Cardiovascular: Normal rate, regular rhythm, normal heart sounds and intact distal pulses.  Pulmonary/Chest: Effort normal and breath sounds normal. No stridor. She has no wheezes. She has no rales.  Abdominal: Bowel sounds are normal. There is tenderness. There is rebound, guarding and tenderness at McBurney's point.  Musculoskeletal: Normal range of motion.  Neurological: She is alert and oriented to person, place, and time. She displays normal reflexes.  Skin: Skin is warm and dry. Capillary refill takes less than 2 seconds.  Psychiatric: She has a normal mood and affect.     ED Treatments / Results  Labs (all labs ordered are listed, but only abnormal results are displayed) Results for orders placed or performed during the hospital encounter of 03/01/18  Lipase, blood  Result Value Ref Range   Lipase 33 11 - 51 U/L  Comprehensive metabolic panel  Result Value Ref Range   Sodium 141 135 - 145 mmol/L   Potassium  3.9 3.5 - 5.1 mmol/L   Chloride 109 98 - 111 mmol/L   CO2 24 22 - 32 mmol/L   Glucose, Bld 99 70 - 99 mg/dL   BUN 9 6 - 20 mg/dL   Creatinine, Ser 8.11 0.44 - 1.00 mg/dL   Calcium 9.4 8.9 - 91.4 mg/dL   Total Protein 7.1 6.5 - 8.1 g/dL   Albumin 4.3 3.5 - 5.0 g/dL   AST 17 15 - 41 U/L   ALT 14 0 - 44 U/L   Alkaline Phosphatase 80 38 - 126 U/L   Total Bilirubin 0.4 0.3 - 1.2 mg/dL   GFR calc non Af Amer >60 >60 mL/min   GFR calc Af Amer >60 >60 mL/min   Anion gap 8 5 - 15  CBC  Result Value Ref Range   WBC 12.6 (H) 4.0 - 10.5 K/uL   RBC 3.92 3.87 - 5.11  MIL/uL   Hemoglobin 11.2 (L) 12.0 - 15.0 g/dL   HCT 78.2 (L) 95.6 - 21.3 %   MCV 88.0 78.0 - 100.0 fL   MCH 28.6 26.0 - 34.0 pg   MCHC 32.5 30.0 - 36.0 g/dL   RDW 08.6 57.8 - 46.9 %   Platelets 255 150 - 400 K/uL  Urinalysis, Routine w reflex microscopic  Result Value Ref Range   Color, Urine YELLOW YELLOW   APPearance CLEAR CLEAR   Specific Gravity, Urine 1.018 1.005 - 1.030   pH 5.0 5.0 - 8.0   Glucose, UA NEGATIVE NEGATIVE mg/dL   Hgb urine dipstick NEGATIVE NEGATIVE   Bilirubin Urine NEGATIVE NEGATIVE   Ketones, ur NEGATIVE NEGATIVE mg/dL   Protein, ur NEGATIVE NEGATIVE mg/dL   Nitrite NEGATIVE NEGATIVE   Leukocytes, UA NEGATIVE NEGATIVE  I-Stat beta hCG blood, ED  Result Value Ref Range   I-stat hCG, quantitative <5.0 <5 mIU/mL   Comment 3           Ct Abdomen Pelvis W Contrast  Result Date: 03/02/2018 CLINICAL DATA:  Right-sided abdominal pain. Patient reports history of appendicitis that was treated with antibiotics rather than surgery. EXAM: CT ABDOMEN AND PELVIS WITH CONTRAST TECHNIQUE: Multidetector CT imaging of the abdomen and pelvis was performed using the standard protocol following bolus administration of intravenous contrast. CONTRAST:  ISOVUE-300 IOPAMIDOL (ISOVUE-300) INJECTION 61% COMPARISON:  None available. FINDINGS: Lower chest: Lung bases are clear. Hepatobiliary: No focal liver abnormality is seen. No gallstones, gallbladder wall thickening, or biliary dilatation. Pancreas: Unremarkable. No pancreatic ductal dilatation or surrounding inflammatory changes. Spleen: Normal in size without focal abnormality. Adrenals/Urinary Tract: Adrenal glands are unremarkable. Kidneys are normal, without renal calculi, focal lesion, or hydronephrosis. Bladder is unremarkable. Stomach/Bowel: Acute appendicitis as described below. Stomach physiologically distended. No small or large bowel wall thickening, inflammatory change or obstruction. Moderate stool in the ascending and  transverse colon. Appendix: Location: Medial to the cecum. Diameter: 8-9 mm Appendicolith: No Mucosal hyper-enhancement: Yes Extraluminal gas: No Periappendiceal collection: No.  Mild periappendiceal stranding. Vascular/Lymphatic: Prominent ileocolic nodes are likely reactive. No acute vascular findings. Reproductive: Uterus and bilateral adnexa are unremarkable. Physiologic involuting corpus luteal cyst in the left ovary. Other: Small volume of free fluid in the pelvis may be reactive or physiologic. No free air. No abdominopelvic abscess. Musculoskeletal: There are no acute or suspicious osseous abnormalities. IMPRESSION: Uncomplicated acute appendicitis. Electronically Signed   By: Narda Rutherford M.D.   On: 03/02/2018 00:55    EKG None  Radiology Ct Abdomen Pelvis W Contrast  Result Date:  03/02/2018 CLINICAL DATA:  Right-sided abdominal pain. Patient reports history of appendicitis that was treated with antibiotics rather than surgery. EXAM: CT ABDOMEN AND PELVIS WITH CONTRAST TECHNIQUE: Multidetector CT imaging of the abdomen and pelvis was performed using the standard protocol following bolus administration of intravenous contrast. CONTRAST:  ISOVUE-300 IOPAMIDOL (ISOVUE-300) INJECTION 61% COMPARISON:  None available. FINDINGS: Lower chest: Lung bases are clear. Hepatobiliary: No focal liver abnormality is seen. No gallstones, gallbladder wall thickening, or biliary dilatation. Pancreas: Unremarkable. No pancreatic ductal dilatation or surrounding inflammatory changes. Spleen: Normal in size without focal abnormality. Adrenals/Urinary Tract: Adrenal glands are unremarkable. Kidneys are normal, without renal calculi, focal lesion, or hydronephrosis. Bladder is unremarkable. Stomach/Bowel: Acute appendicitis as described below. Stomach physiologically distended. No small or large bowel wall thickening, inflammatory change or obstruction. Moderate stool in the ascending and transverse colon.  Appendix: Location: Medial to the cecum. Diameter: 8-9 mm Appendicolith: No Mucosal hyper-enhancement: Yes Extraluminal gas: No Periappendiceal collection: No.  Mild periappendiceal stranding. Vascular/Lymphatic: Prominent ileocolic nodes are likely reactive. No acute vascular findings. Reproductive: Uterus and bilateral adnexa are unremarkable. Physiologic involuting corpus luteal cyst in the left ovary. Other: Small volume of free fluid in the pelvis may be reactive or physiologic. No free air. No abdominopelvic abscess. Musculoskeletal: There are no acute or suspicious osseous abnormalities. IMPRESSION: Uncomplicated acute appendicitis. Electronically Signed   By: Narda Rutherford M.D.   On: 03/02/2018 00:55    Procedures Procedures (including critical care time)  Medications Ordered in ED Medications  0.9 %  sodium chloride infusion ( Intravenous New Bag/Given 03/01/18 2351)  sodium chloride 0.9 % injection (has no administration in time range)  cefTRIAXone (ROCEPHIN) 2 g in sodium chloride 0.9 % 100 mL IVPB (has no administration in time range)    And  metroNIDAZOLE (FLAGYL) IVPB 500 mg (has no administration in time range)  fentaNYL (SUBLIMAZE) injection 50 mcg (50 mcg Intravenous Given 03/01/18 2357)  ondansetron (ZOFRAN) injection 4 mg (4 mg Intravenous Given 03/01/18 2351)  iopamidol (ISOVUE-300) 61 % injection 100 mL (100 mLs Intravenous Contrast Given 03/02/18 0023)      Final Clinical Impressions(s) / ED Diagnoses   Final diagnoses:  Acute appendicitis, unspecified acute appendicitis type    Admit to surgery case d/w Dr. Cindra Eves, Jamiya Nims, MD 03/02/18 0126

## 2018-03-02 NOTE — Op Note (Signed)
Preoperative Diagnosis: Acute appendicitis  Postoprative Diagnosis: Same  Procedure: Procedure(s): APPENDECTOMY LAPAROSCOPIC   Surgeon: Glenna Fellows T   Assistants: None  Anesthesia:  General endotracheal anesthesia  Indications: Patient is a 22 year old female who presents with typical lower abdominal pain and nausea and right lower quadrant tenderness with CT findings consistent with acute appendicitis.  She has a previous history of acute appendicitis treated with antibiotics.  After discussion with the patient and her mother regarding options and risks of surgery detailed elsewhere we have elected to proceed with laparoscopic appendectomy.    Procedure Detail: Patient had received preoperative IV antibiotics.  She was brought to the operating room, placed in the supine position on the operating table, and general endotracheal anesthesia induced.  Foley catheter was placed.  The abdomen was widely sterilely prepped and draped.  Patient timeout was performed and correct procedure verified.  Access was obtained with a 1/2 cm incision in the umbilicus with an open Hassan technique through mattress suture of 0 Vicryl and pneumoperitoneum established.  Under direct vision 5 mm trochars were placed in the epigastrium and left lower quadrant.  The appendix was located adjacent to the cecum.  It was acutely inflamed but no gangrene or perforation.  It was very mobile.  The appendix was elevated and the mesoappendix sequentially divided with the harmonic scalpel until the appendix was completely freed to the tip of the cecum.  The appendix was then divided across the tip of the cecum with a single firing of the Endo GIA 45 mm white load stapler.  The staple line was intact and without bleeding.  The appendix was placed in an eco-sac and brought up through the umbilical incision.  The abdomen was again inspected and there was no evidence of bleeding or injury or other problems.  All CO2 was evacuated  and the mattress suture secured at the umbilicus.  Skin incisions were closed with subarticular Monocryl and Dermabond.  Sponge needle and instrument counts were correct.    Findings: Acute appendicitis without perforation or gangrene  Estimated Blood Loss:  Minimal         Drains: None  Blood Given: none          Specimens: Appendix        Complications:  * No complications entered in OR log *         Disposition: PACU - hemodynamically stable.         Condition: stable

## 2018-03-03 ENCOUNTER — Encounter (HOSPITAL_COMMUNITY): Payer: Self-pay | Admitting: General Surgery

## 2018-03-03 MED ORDER — OXYCODONE HCL 5 MG PO TABS: 5.0000 mg | ORAL_TABLET | Freq: Four times a day (QID) | ORAL | 0 refills | Status: AC | PRN

## 2018-03-03 NOTE — Discharge Summary (Signed)
[facility]/Trauma Discharge Summary   Patient ID: Sherri Willis MRN: [id number] DOB/AGE: [birth year] 22 y.o.  Admit date: 03/01/2018 Discharge date: 03/03/2018  Admitting Diagnosis: appendicitis  Discharge Diagnosis Patient Active Problem List   Diagnosis Date Noted  . Acute appendicitis s/p lap appendectomy 03/02/2018 03/02/2018    Consultants none  Imaging: Ct Abdomen Pelvis W Contrast  Result Date: 03/02/2018 CLINICAL DATA:  Right-sided abdominal pain. Patient reports history of appendicitis that was treated with antibiotics rather than surgery. EXAM: CT ABDOMEN AND PELVIS WITH CONTRAST TECHNIQUE: Multidetector CT imaging of the abdomen and pelvis was performed using the standard protocol following bolus administration of intravenous contrast. CONTRAST:  ISOVUE-300 IOPAMIDOL (ISOVUE-300) INJECTION 61% COMPARISON:  None available. FINDINGS: Lower chest: Lung bases are clear. Hepatobiliary: No focal liver abnormality is seen. No gallstones, gallbladder wall thickening, or biliary dilatation. Pancreas: Unremarkable. No pancreatic ductal dilatation or surrounding inflammatory changes. Spleen: Normal in size without focal abnormality. Adrenals/Urinary Tract: Adrenal glands are unremarkable. Kidneys are normal, without renal calculi, focal lesion, or hydronephrosis. Bladder is unremarkable. Stomach/Bowel: Acute appendicitis as described below. Stomach physiologically distended. No small or large bowel wall thickening, inflammatory change or obstruction. Moderate stool in the ascending and transverse colon. Appendix: Location: Medial to the cecum. Diameter: 8-9 mm Appendicolith: No Mucosal hyper-enhancement: Yes Extraluminal gas: No Periappendiceal collection: No.  Mild periappendiceal stranding. Vascular/Lymphatic: Prominent ileocolic nodes are likely reactive. No acute vascular findings. Reproductive: Uterus and bilateral adnexa are unremarkable. Physiologic involuting corpus  luteal cyst in the left ovary. Other: Small volume of free fluid in the pelvis may be reactive or physiologic. No free air. No abdominopelvic abscess. Musculoskeletal: There are no acute or suspicious osseous abnormalities. IMPRESSION: Uncomplicated acute appendicitis. Electronically Signed   By: Narda Rutherford M.D.   On: 03/02/2018 00:55    Procedures Dr. Johna Sheriff (03/02/18) - Laparoscopic Appendectomy  HPI: Asked to see patient at the request of Dr. Daun Peacock for abdominal pain.  The patient presented with a 2-day history of generalized abdominal pain which now localized to her right lower quadrant.  The pain is sharp in nature without radiation.  The pain is a 5-6 out of 10.  Location is right lower quadrant of abdomen.  There is no associated nausea or vomiting.  CT scan showed uncomplicated acute appendicitis.  She denies any associated fever or chills.  Hospital Course:  Workup showed appendicitis.  Patient was admitted and underwent procedure listed above.  Tolerated procedure well and was transferred to the floor.  Diet was advanced as tolerated.  On POD#1, the patient was voiding well, tolerating diet, ambulating well, pain well controlled, vital signs stable, incisions c/d/i and felt stable for discharge home.  Patient will follow up as outlined below and knows to call with questions or concerns.     Patient was discharged in good condition.  The [place] Substance controlled database was reviewed prior to prescribing narcotic pain medication to this patient.  Physical Exam: General:  Alert, NAD, pleasant, cooperative Cardio: RRR, S1 & S2 normal, no murmur, rubs, gallops Resp: Effort normal, lungs CTA bilaterally, no wheezes, rales, rhonchi Abd:  Soft, ND, normal bowel sounds, mild TTP around incisions, incisions with glue intact appear well with mild ecchymosis. No guarding  Skin: warm and dry, no rashes noted  Allergies as of 03/03/2018      Reactions   Morphine And Related  Anxiety      Medication List    TAKE these medications   oxyCODONE 5 MG  immediate release tablet Commonly known as:  Oxy IR/ROXICODONE Take 1 tablet (5 mg total) by mouth every 6 (six) hours as needed for moderate pain.        Follow-up Information    [facility] Follow up on 03/16/2018.   Specialty:  General Surgery Why:  Your appointment is at 4 pm. Be at the office 30 minutes early for check in.  Bring photo ID and insurance information.   Contact information: [address] [place] [address] [phone]           Signed: Joyce Copa [facility] 03/03/2018, 9:52 AM Pager: [phone] Consults: [phone] Mon-Fri 7:00 am-4:30 pm Sat-Sun 7:00 am-11:30 am

## 2018-03-03 NOTE — Progress Notes (Signed)
Patient has discharged to home on 03/03/18. Discharge instruction including medication and appointment was given to patient. Notified Cm about potential condition code 44 as well. Patient has no question at this time.

## 2019-03-30 IMAGING — CT CT ABD-PELV W/ CM
2 of 4 series · 15 of 46 positions shown, 17 images · IV contrast (ISOVUE)
Comparison: None available.

CLINICAL DATA: Right-sided abdominal pain. Patient reports history
of appendicitis that was treated with antibiotics rather than
surgery.

EXAM:
CT ABDOMEN AND PELVIS WITH CONTRAST
TECHNIQUE: Multidetector CT imaging of the abdomen and pelvis was performed
using the standard protocol following bolus administration of
intravenous contrast.
CONTRAST:  100mL O2WPNN-IRR IOPAMIDOL (O2WPNN-IRR) INJECTION 61%

[Series 2: axial st · axial · 0.63mm/px · z∈[+1066,+1446]mm · 12 of 86 slices shown, 14 images]
[im 5/86  soft-tissue]
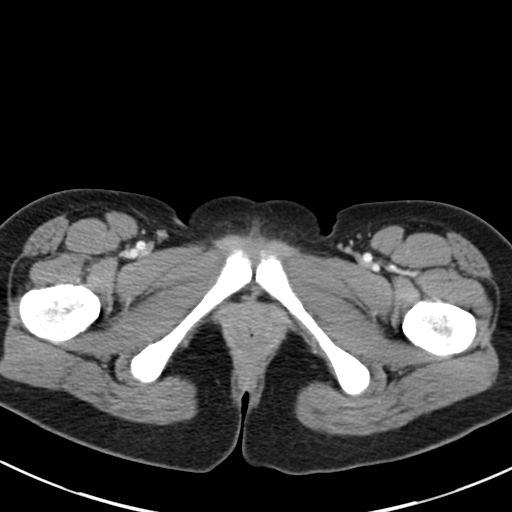
[im 5/86  bone]
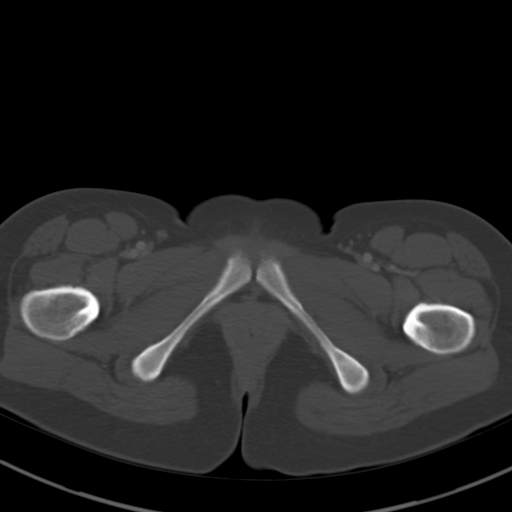
[im 14/86  soft-tissue]
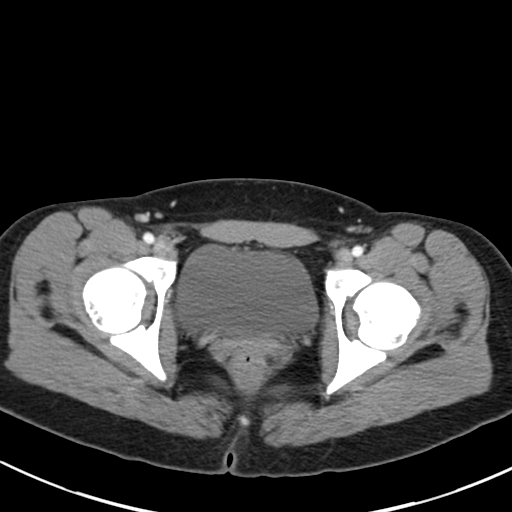
[im 18/86  soft-tissue]
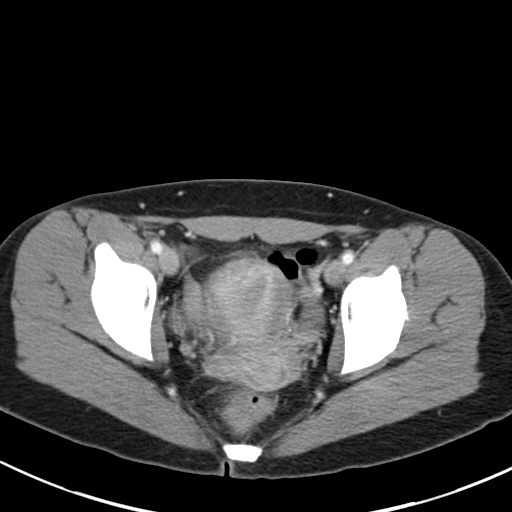
[im 27/86  soft-tissue]
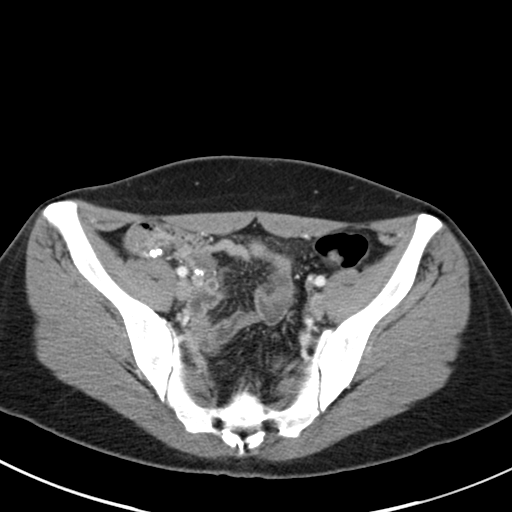
[im 32/86  soft-tissue]
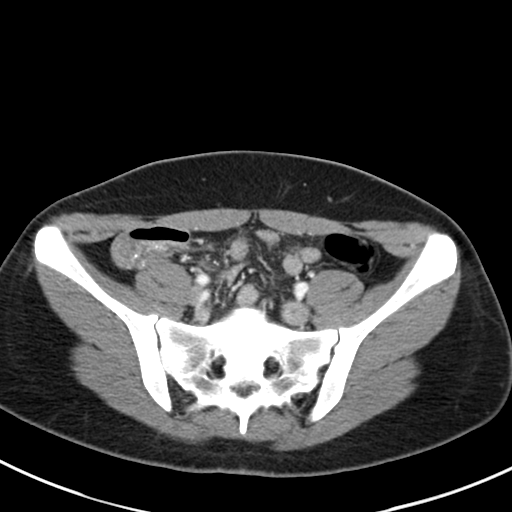
[im 41/86  soft-tissue]
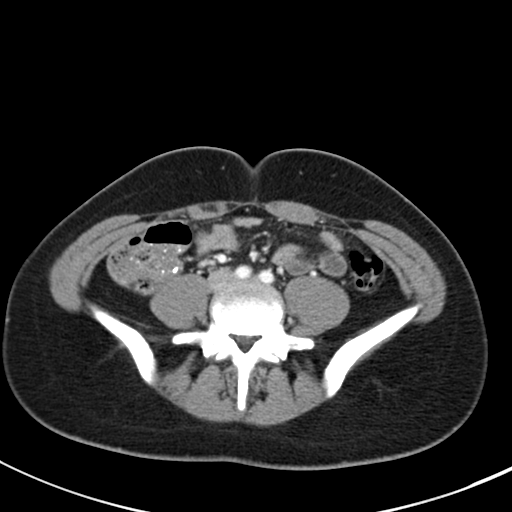
[im 45/86  soft-tissue]
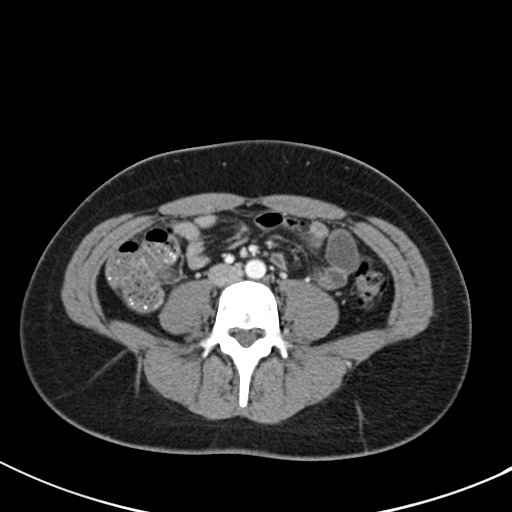
[im 54/86  soft-tissue]
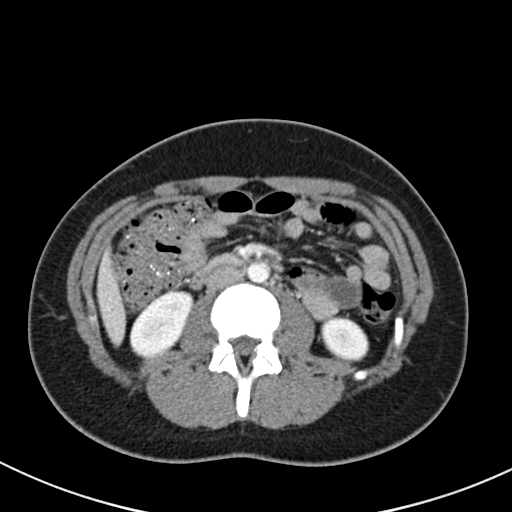
[im 59/86  soft-tissue]
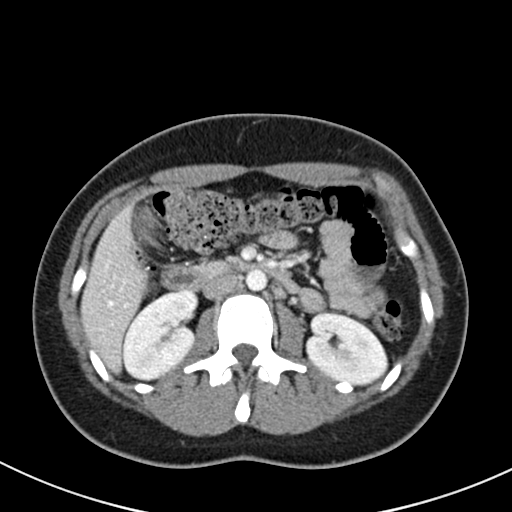
[im 59/86  bone]
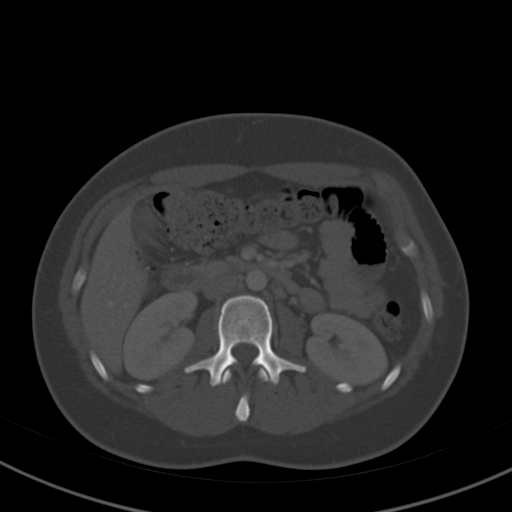
[im 68/86  soft-tissue]
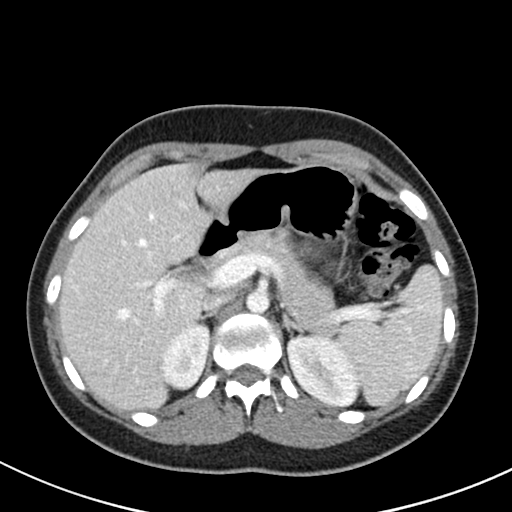
[im 72/86  soft-tissue]
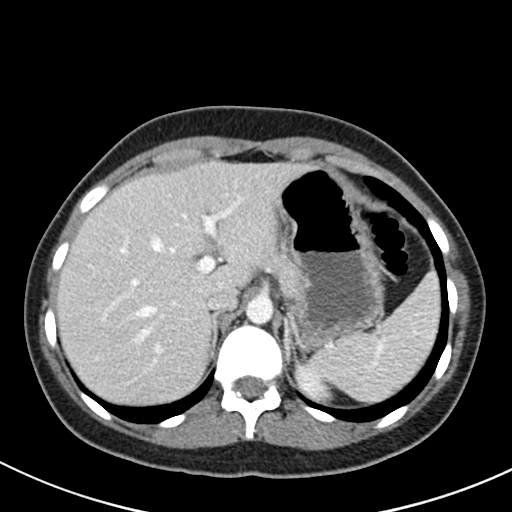
[im 81/86  soft-tissue]
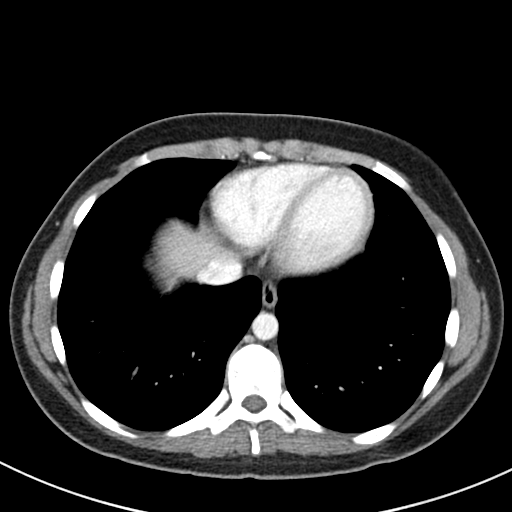

[Series 4: coronal st · coronal · 0.56mm/px · 3 of 63 slices shown]
[im 21/63  soft-tissue]
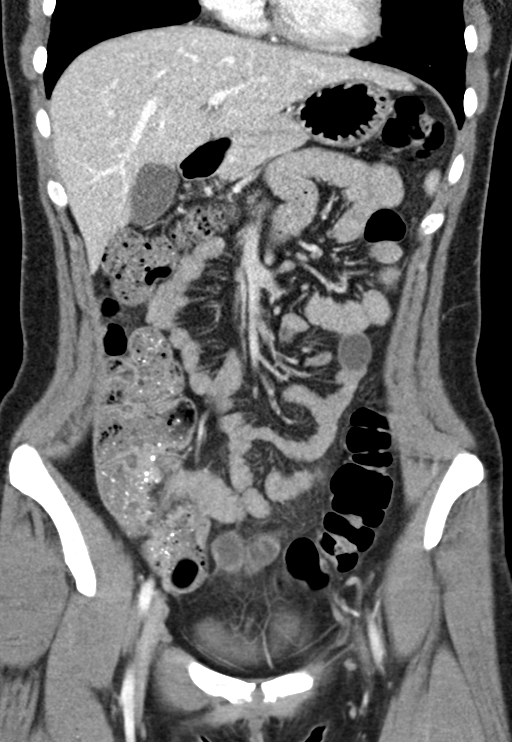
[im 28/63  soft-tissue]
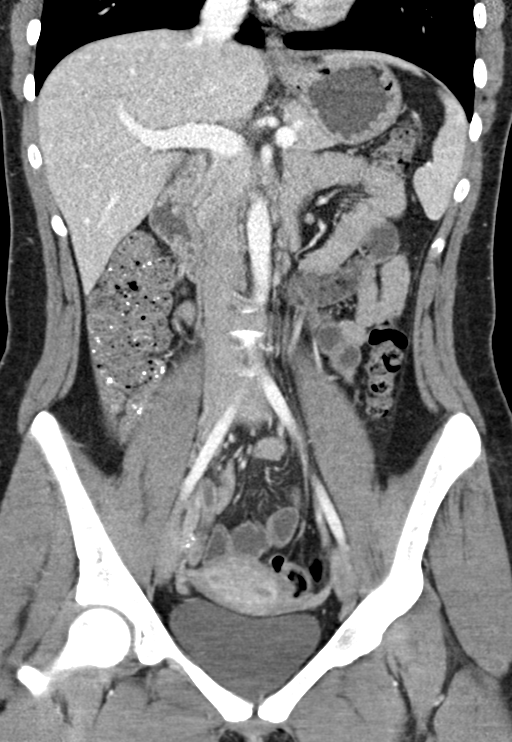
[im 35/63  soft-tissue]
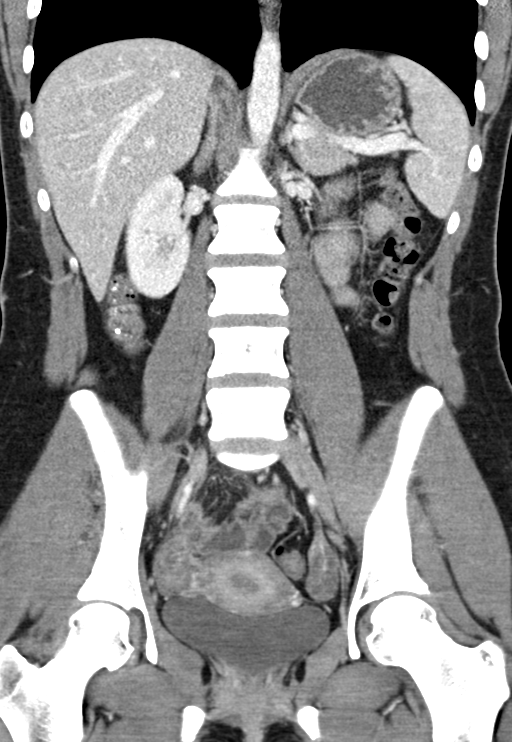

[15 of 46 positions shown; findings below may reference images not displayed]

FINDINGS: Lower chest: Lung bases are clear.

Hepatobiliary: No focal liver abnormality is seen. No gallstones,
gallbladder wall thickening, or biliary dilatation.

Pancreas: Unremarkable. No pancreatic ductal dilatation or
surrounding inflammatory changes.

Spleen: Normal in size without focal abnormality.

Adrenals/Urinary Tract: Adrenal glands are unremarkable. Kidneys are
normal, without renal calculi, focal lesion, or hydronephrosis.
Bladder is unremarkable.

Stomach/Bowel: Acute appendicitis as described below. Stomach
physiologically distended. No small or large bowel wall thickening,
inflammatory change or obstruction. Moderate stool in the ascending
and transverse colon.

Appendix: Location: Medial to the cecum.

Diameter: 8-9 mm

Appendicolith: No

Mucosal hyper-enhancement: Yes

Extraluminal gas: No

Periappendiceal collection: No.  Mild periappendiceal stranding.

Vascular/Lymphatic: Prominent ileocolic nodes are likely reactive.
No acute vascular findings.

Reproductive: Uterus and bilateral adnexa are unremarkable.
Physiologic involuting corpus luteal cyst in the left ovary.

Other: Small volume of free fluid in the pelvis may be reactive or
physiologic. No free air. No abdominopelvic abscess.

Musculoskeletal: There are no acute or suspicious osseous
abnormalities.
IMPRESSION: Uncomplicated acute appendicitis.

## 2019-10-12 ENCOUNTER — Encounter (HOSPITAL_COMMUNITY): Payer: Self-pay | Admitting: *Deleted

## 2019-10-12 ENCOUNTER — Emergency Department (HOSPITAL_COMMUNITY)
Admission: EM | Admit: 2019-10-12 | Discharge: 2019-10-12 | Disposition: A | Discharge status: Home / Self Care | Payer: Self-pay | Attending: Emergency Medicine | Admitting: Emergency Medicine

## 2019-10-12 ENCOUNTER — Other Ambulatory Visit: Payer: Self-pay

## 2019-10-12 ENCOUNTER — Emergency Department (HOSPITAL_BASED_OUTPATIENT_CLINIC_OR_DEPARTMENT_OTHER): Payer: Self-pay

## 2019-10-12 DIAGNOSIS — M79661 Pain in right lower leg: Secondary | ICD-10-CM | POA: Insufficient documentation

## 2019-10-12 DIAGNOSIS — R609 Edema, unspecified: Secondary | ICD-10-CM

## 2019-10-12 NOTE — ED Triage Notes (Signed)
Pt complains of right lower leg pain and swelling since 4AM this morning. She had COVID 1 month ago and is concerned she has blood clot. She is on OCP. She denies injury.

## 2019-10-12 NOTE — Discharge Instructions (Addendum)
Your ultrasound of your leg today was negative for blood clots

## 2019-10-12 NOTE — Progress Notes (Signed)
Right lower extremity venous duplex completed. Refer to "CV Proc" under chart review to view preliminary results.  10/12/2019 2:58 PM Eula Fried., MHA, RVT, RDCS, RDMS

## 2019-10-12 NOTE — ED Notes (Signed)
US at bedside

## 2019-10-12 NOTE — ED Provider Notes (Signed)
[facility] DEPT Provider Note   CSN: [id number] Arrival date & time: 10/12/19  1341     History Chief Complaint  Patient presents with  . Leg Pain    right    Sherri Willis is a 24 y.o. female.  [age]-year-old female presents with right-sided calf pain x1 day.  Denies any chest pain or shortness of breath.  Patient had Covid a month ago and does take birth control pills.  Denies any history of trauma.  Concern for possible DVT.  No prior history of DVT.  Pain is worse with walking and has improved throughout the day as well as her swelling has improved.        Past Medical History:  Diagnosis Date  . Appendicitis     Patient Active Problem List   Diagnosis Date Noted  . Acute appendicitis s/p lap appendectomy 03/02/2018 03/02/2018    Past Surgical History:  Procedure Laterality Date  . LAPAROSCOPIC APPENDECTOMY N/A 03/02/2018   Procedure: APPENDECTOMY LAPAROSCOPIC;  Surgeon: Excell Seltzer, MD;  Location: [facility];  Service: General;  Laterality: N/A;  . TONSILLECTOMY       OB History   No obstetric history on file.     No family history on file.  Social History   Tobacco Use  . Smoking status: Never Smoker  . Smokeless tobacco: Never Used  Substance Use Topics  . Alcohol use: Not Currently  . Drug use: Never    Home Medications Prior to Admission medications   Medication Sig Start Date End Date Taking? Authorizing Provider  oxyCODONE (OXY IR/ROXICODONE) 5 MG immediate release tablet Take 1 tablet (5 mg total) by mouth every 6 (six) hours as needed for moderate pain. 03/03/18   Kalman Drape, PA    Allergies    Morphine and related  Review of Systems   Review of Systems  All other systems reviewed and are negative.   Physical Exam Updated Vital Signs BP 116/66   Pulse 66   Temp 98.4 F (36.9 C) (Oral)   Resp 18   LMP 09/24/2019   SpO2 98%   Physical Exam Vitals and nursing note reviewed.    Constitutional:      General: She is not in acute distress.    Appearance: Normal appearance. She is well-developed. She is not toxic-appearing.  HENT:     Head: Normocephalic and atraumatic.  Eyes:     General: Lids are normal.     Conjunctiva/sclera: Conjunctivae normal.     Pupils: Pupils are equal, round, and reactive to light.  Neck:     Thyroid: No thyroid mass.     Trachea: No tracheal deviation.  Cardiovascular:     Rate and Rhythm: Normal rate and regular rhythm.     Heart sounds: Normal heart sounds. No murmur. No gallop.   Pulmonary:     Effort: Pulmonary effort is normal. No respiratory distress.     Breath sounds: Normal breath sounds. No stridor. No decreased breath sounds, wheezing, rhonchi or rales.  Abdominal:     General: Bowel sounds are normal. There is no distension.     Palpations: Abdomen is soft.     Tenderness: There is no abdominal tenderness. There is no rebound.  Musculoskeletal:        General: Normal range of motion.     Cervical back: Normal range of motion and neck supple.     Right lower leg: Tenderness present. No deformity. Edema present.  Skin:  General: Skin is warm and dry.     Findings: No abrasion or rash.  Neurological:     Mental Status: She is alert and oriented to person, place, and time.     GCS: GCS eye subscore is 4. GCS verbal subscore is 5. GCS motor subscore is 6.     Cranial Nerves: No cranial nerve deficit.     Sensory: No sensory deficit.  Psychiatric:        Speech: Speech normal.        Behavior: Behavior normal.     ED Results / Procedures / Treatments   Labs (all labs ordered are listed, but only abnormal results are displayed) Labs Reviewed - No data to display  EKG None  Radiology No results found.  Procedures Procedures (including critical care time)  Medications Ordered in ED Medications - No data to display  ED Course  I have reviewed the triage vital signs and the nursing notes.  Pertinent  labs & imaging results that were available during my care of the patient were reviewed by me and considered in my medical decision making (see chart for details).    MDM Rules/Calculators/A&P                      Ultrasound of right lower leg negative for DVT Final Clinical Impression(s) / ED Diagnoses Final diagnoses:  None    Rx / DC Orders ED Discharge Orders    None       Lorre Nick, MD 10/12/19 1501

## 2019-10-12 NOTE — ED Notes (Signed)
Pt verbalizes understanding of DC instructions. Pt belongings returned and is ambulatory out of ED.
